# Patient Record
Sex: Male | Born: 1950 | Race: Black or African American | Hispanic: No | Smoking: Never smoker
Health system: Southern US, Community
[De-identification: ages and names within clinical notes are randomized; demographics above are authoritative.]

## PROBLEM LIST (undated history)

## (undated) DIAGNOSIS — C61 Malignant neoplasm of prostate: Secondary | ICD-10-CM

---

## 2004-06-20 ENCOUNTER — Other Ambulatory Visit: Payer: Self-pay

## 2005-02-17 ENCOUNTER — Emergency Department: Payer: Self-pay | Admitting: Emergency Medicine

## 2005-02-28 ENCOUNTER — Ambulatory Visit: Payer: Self-pay | Admitting: General Practice

## 2005-03-07 ENCOUNTER — Ambulatory Visit: Payer: Self-pay | Admitting: General Practice

## 2006-08-26 ENCOUNTER — Ambulatory Visit: Payer: Self-pay | Admitting: Gastroenterology

## 2008-02-23 ENCOUNTER — Ambulatory Visit: Payer: Self-pay | Admitting: General Practice

## 2008-03-16 ENCOUNTER — Ambulatory Visit: Payer: Self-pay | Admitting: General Practice

## 2019-03-07 ENCOUNTER — Other Ambulatory Visit: Payer: Self-pay

## 2019-03-08 ENCOUNTER — Encounter (INDEPENDENT_AMBULATORY_CARE_PROVIDER_SITE_OTHER): Payer: Self-pay

## 2019-03-08 ENCOUNTER — Other Ambulatory Visit: Payer: Self-pay

## 2019-03-08 ENCOUNTER — Ambulatory Visit
Admission: RE | Admit: 2019-03-08 | Discharge: 2019-03-08 | Disposition: A | Discharge status: Home / Self Care | Payer: Medicare Other | Source: Ambulatory Visit | Attending: Radiation Oncology | Admitting: Radiation Oncology

## 2019-03-08 ENCOUNTER — Encounter: Payer: Self-pay | Admitting: Radiation Oncology

## 2019-03-08 VITALS — BP 135/86 | HR 63 | Temp 97.1°F | Resp 18 | Ht 73.0 in | Wt 213.4 lb

## 2019-03-08 DIAGNOSIS — R972 Elevated prostate specific antigen [PSA]: Secondary | ICD-10-CM | POA: Diagnosis not present

## 2019-03-08 DIAGNOSIS — C61 Malignant neoplasm of prostate: Secondary | ICD-10-CM | POA: Insufficient documentation

## 2019-03-08 HISTORY — DX: Malignant neoplasm of prostate: C61

## 2019-03-08 NOTE — Consult Note (Signed)
NEW PATIENT EVALUATION  Name: Jason Richards  MRN: [id number]  Date:   03/08/2019     DOB: [birth year]   This 68 y.o. 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  REFERRING PHYSICIAN: No ref. provider found  CHIEF COMPLAINT:  Chief Complaint  Patient presents with  . Prostate Cancer    Initial consultation    DIAGNOSIS: The encounter diagnosis was Malignant neoplasm of prostate ([place]).   PREVIOUS INVESTIGATIONS:  Pathology report reviewed MRI and bone scan report reviewed Clinical notes reviewed  HPI: Patient is a 68 year old male presented with an elevated PSA back in 2010 of 5.  He originally had an MRI scan of his prostate back in 2017 with no significant findings.  MRI scan in 2019 showed a defined focus within the right anterior lateral body appendix of the prostate with a regular prostatic capsule possibly representing high-grade prostate cancer.  He underwent prostate biopsy back in September 2019 showing 3 of 6 cores positive for Gleason 6 (3+3) adenocarcinoma and a 27 cc prostate.  He had repeat biopsy with 28- cores and 3+ cores.  His PSA has climbed to 27.5.  He had a bone scan which according to records were negative for evidence of metastatic disease.  He is part of the [place] system is now referred close to home for consideration of treatment.  Patient is opted for radiation therapy.  He has some urinary frequency and urgency nocturia x2-3.  PLANNED TREATMENT REGIMEN: IMRT radiation therapy to prostate  PAST MEDICAL HISTORY:  has a past medical history of Prostate cancer ([place]).    PAST SURGICAL HISTORY: History reviewed. No pertinent surgical history.  FAMILY HISTORY: family history is not on file.  SOCIAL HISTORY:  reports that he has never smoked. He has never used smokeless tobacco. He reports that he does not drink alcohol or use drugs.  ALLERGIES:  Patient has no known allergies.  MEDICATIONS:  Current Outpatient Medications  Medication Sig Dispense Refill  . aspirin EC 81 MG tablet Take 81 mg by mouth daily.    . Multiple Vitamin (MULTIVITAMIN WITH MINERALS) TABS tablet Take 1 tablet by mouth daily.     No current facility-administered medications for this encounter.     ECOG PERFORMANCE STATUS:  1 - Symptomatic but completely ambulatory  REVIEW OF SYSTEMS: Patient denies any weight loss, fatigue, weakness, fever, chills or night sweats. Patient denies any loss of vision, blurred vision. Patient denies any ringing  of the ears or hearing loss. No irregular heartbeat. Patient denies heart murmur or history of fainting. Patient denies any chest pain or pain radiating to her upper extremities. Patient denies any shortness of breath, difficulty breathing at night, cough or hemoptysis. Patient denies any swelling in the lower legs. Patient denies any nausea vomiting, vomiting of blood, or coffee ground material in the vomitus. Patient denies any stomach pain. Patient states has had normal bowel movements no significant constipation or diarrhea. Patient denies any dysuria, hematuria or significant nocturia. Patient denies any problems walking, swelling in the joints or loss of balance. Patient denies any skin changes, loss of hair or loss of weight. Patient denies any excessive worrying or anxiety or significant depression. Patient denies any problems with insomnia. Patient denies excessive thirst, polyuria, polydipsia. Patient denies any swollen glands, patient denies easy bruising or easy bleeding. Patient denies any recent infections, allergies or URI. Patient "  s visual fields have not changed significantly in recent time.   PHYSICAL EXAM: BP 135/86 (BP Location: Left Arm, Patient Position: Sitting)   Pulse 63   Temp (!) 97.1 F (36.2 C) (Tympanic)   Resp 18   Ht 6\' 1"  (1.854 m)   Wt 213 lb 6.5 oz (96.8 kg)   BMI 28.16 kg/m   Well-developed well-nourished patient in NAD. HEENT reveals PERLA, EOMI, discs not visualized.  Oral cavity is clear. No oral mucosal lesions are identified. Neck is clear without evidence of cervical or supraclavicular adenopathy. Lungs are clear to A&P. Cardiac examination is essentially unremarkable with regular rate and rhythm without murmur rub or thrill. Abdomen is benign with no organomegaly or masses noted. Motor sensory and DTR levels are equal and symmetric in the upper and lower extremities. Cranial nerves II through XII are grossly intact. Proprioception is intact. No peripheral adenopathy or edema is identified. No motor or sensory levels are noted. Crude visual fields are within normal range.  LABORATORY DATA: Pathology reports reviewed    RADIOLOGY RESULTS: MRI and bone scan report reviewed   IMPRESSION: Stage IIb high risk prostate cancer based on elevated PSA in 68 year old male with unusual Gleason 6 adenocarcinoma  PLAN: I have run the [facility] nomogram on the patient.  This shows a 64% chance of extracapsular extension and a 7% chance of lymph node involvement.  I have recommended and discussed both external beam treatment and I-125 interstitial implant.  Patient is interested in external beam IMRT treatment.  I would plan on delivering 8000 cGy over 8 weeks.  Risks and benefits of treatment including increased lower urinary tract symptoms diarrhea fatigue alteration of blood counts skin reaction all were described in detail to the patient.  He seems to comprehend my treatment plan well.  I have personally set up and ordered CT simulation.  Also would like the patient to be on Lupron suppression and according to the patient he is already received his first dose we will double check on that.  Patient seems to comprehend my treatment plan well.  I would like to take this opportunity to thank you for allowing me to participate in the care of your patient.Noreene Filbert, MD

## 2019-03-09 ENCOUNTER — Other Ambulatory Visit: Payer: Self-pay

## 2019-03-10 ENCOUNTER — Ambulatory Visit
Admission: RE | Admit: 2019-03-10 | Discharge: 2019-03-10 | Disposition: A | Discharge status: Home / Self Care | Payer: Medicare Other | Source: Ambulatory Visit | Attending: Radiation Oncology | Admitting: Radiation Oncology

## 2019-03-10 ENCOUNTER — Other Ambulatory Visit: Payer: Self-pay

## 2019-03-10 DIAGNOSIS — C61 Malignant neoplasm of prostate: Secondary | ICD-10-CM | POA: Diagnosis not present

## 2019-03-10 DIAGNOSIS — Z510 Encounter for antineoplastic radiation therapy: Secondary | ICD-10-CM | POA: Insufficient documentation

## 2019-03-11 DIAGNOSIS — Z510 Encounter for antineoplastic radiation therapy: Secondary | ICD-10-CM | POA: Diagnosis not present

## 2019-03-12 ENCOUNTER — Other Ambulatory Visit: Payer: Self-pay | Admitting: *Deleted

## 2019-03-12 DIAGNOSIS — C61 Malignant neoplasm of prostate: Secondary | ICD-10-CM

## 2019-03-18 ENCOUNTER — Ambulatory Visit
Admission: RE | Admit: 2019-03-18 | Discharge: 2019-03-18 | Disposition: A | Discharge status: Home / Self Care | Payer: Medicare Other | Source: Ambulatory Visit | Attending: Radiation Oncology | Admitting: Radiation Oncology

## 2019-03-18 ENCOUNTER — Other Ambulatory Visit: Payer: Self-pay

## 2019-03-22 ENCOUNTER — Ambulatory Visit
Admission: RE | Admit: 2019-03-22 | Discharge: 2019-03-22 | Disposition: A | Discharge status: Home / Self Care | Payer: Medicare Other | Source: Ambulatory Visit | Attending: Radiation Oncology | Admitting: Radiation Oncology

## 2019-03-22 ENCOUNTER — Other Ambulatory Visit: Payer: Self-pay

## 2019-03-22 DIAGNOSIS — C61 Malignant neoplasm of prostate: Secondary | ICD-10-CM | POA: Insufficient documentation

## 2019-03-22 DIAGNOSIS — Z510 Encounter for antineoplastic radiation therapy: Secondary | ICD-10-CM | POA: Insufficient documentation

## 2019-03-23 ENCOUNTER — Other Ambulatory Visit: Payer: Self-pay

## 2019-03-23 ENCOUNTER — Ambulatory Visit
Admission: RE | Admit: 2019-03-23 | Discharge: 2019-03-23 | Disposition: A | Discharge status: Home / Self Care | Payer: Medicare Other | Source: Ambulatory Visit | Attending: Radiation Oncology | Admitting: Radiation Oncology

## 2019-03-23 DIAGNOSIS — Z510 Encounter for antineoplastic radiation therapy: Secondary | ICD-10-CM | POA: Diagnosis not present

## 2019-03-24 ENCOUNTER — Other Ambulatory Visit: Payer: Self-pay

## 2019-03-24 ENCOUNTER — Ambulatory Visit
Admission: RE | Admit: 2019-03-24 | Discharge: 2019-03-24 | Disposition: A | Discharge status: Home / Self Care | Payer: Medicare Other | Source: Ambulatory Visit | Attending: Radiation Oncology | Admitting: Radiation Oncology

## 2019-03-24 DIAGNOSIS — Z510 Encounter for antineoplastic radiation therapy: Secondary | ICD-10-CM | POA: Diagnosis not present

## 2019-03-25 ENCOUNTER — Other Ambulatory Visit: Payer: Self-pay

## 2019-03-25 ENCOUNTER — Ambulatory Visit
Admission: RE | Admit: 2019-03-25 | Discharge: 2019-03-25 | Disposition: A | Discharge status: Home / Self Care | Payer: Medicare Other | Source: Ambulatory Visit | Attending: Radiation Oncology | Admitting: Radiation Oncology

## 2019-03-25 DIAGNOSIS — Z510 Encounter for antineoplastic radiation therapy: Secondary | ICD-10-CM | POA: Diagnosis not present

## 2019-03-26 ENCOUNTER — Other Ambulatory Visit: Payer: Self-pay

## 2019-03-26 ENCOUNTER — Ambulatory Visit
Admission: RE | Admit: 2019-03-26 | Discharge: 2019-03-26 | Disposition: A | Discharge status: Home / Self Care | Payer: Medicare Other | Source: Ambulatory Visit | Attending: Radiation Oncology | Admitting: Radiation Oncology

## 2019-03-26 DIAGNOSIS — Z510 Encounter for antineoplastic radiation therapy: Secondary | ICD-10-CM | POA: Diagnosis not present

## 2019-03-29 ENCOUNTER — Ambulatory Visit
Admission: RE | Admit: 2019-03-29 | Discharge: 2019-03-29 | Disposition: A | Discharge status: Home / Self Care | Payer: Medicare Other | Source: Ambulatory Visit | Attending: Radiation Oncology | Admitting: Radiation Oncology

## 2019-03-29 ENCOUNTER — Other Ambulatory Visit: Payer: Self-pay

## 2019-03-29 DIAGNOSIS — Z510 Encounter for antineoplastic radiation therapy: Secondary | ICD-10-CM | POA: Diagnosis not present

## 2019-03-30 ENCOUNTER — Other Ambulatory Visit: Payer: Self-pay | Admitting: *Deleted

## 2019-03-30 ENCOUNTER — Other Ambulatory Visit: Payer: Self-pay

## 2019-03-30 ENCOUNTER — Ambulatory Visit
Admission: RE | Admit: 2019-03-30 | Discharge: 2019-03-30 | Disposition: A | Discharge status: Home / Self Care | Payer: Medicare Other | Source: Ambulatory Visit | Attending: Radiation Oncology | Admitting: Radiation Oncology

## 2019-03-30 DIAGNOSIS — Z510 Encounter for antineoplastic radiation therapy: Secondary | ICD-10-CM | POA: Diagnosis not present

## 2019-03-30 MED ORDER — TAMSULOSIN HCL 0.4 MG PO CAPS: 0.4000 mg | ORAL_CAPSULE | Freq: Every day | ORAL | 6 refills | Status: DC

## 2019-03-31 ENCOUNTER — Other Ambulatory Visit: Payer: Self-pay

## 2019-03-31 ENCOUNTER — Ambulatory Visit
Admission: RE | Admit: 2019-03-31 | Discharge: 2019-03-31 | Disposition: A | Discharge status: Home / Self Care | Payer: Medicare Other | Source: Ambulatory Visit | Attending: Radiation Oncology | Admitting: Radiation Oncology

## 2019-03-31 DIAGNOSIS — Z510 Encounter for antineoplastic radiation therapy: Secondary | ICD-10-CM | POA: Diagnosis not present

## 2019-04-01 ENCOUNTER — Ambulatory Visit
Admission: RE | Admit: 2019-04-01 | Discharge: 2019-04-01 | Disposition: A | Discharge status: Home / Self Care | Payer: Medicare Other | Source: Ambulatory Visit | Attending: Radiation Oncology | Admitting: Radiation Oncology

## 2019-04-01 ENCOUNTER — Other Ambulatory Visit: Payer: Self-pay

## 2019-04-01 DIAGNOSIS — Z510 Encounter for antineoplastic radiation therapy: Secondary | ICD-10-CM | POA: Diagnosis not present

## 2019-04-02 ENCOUNTER — Ambulatory Visit
Admission: RE | Admit: 2019-04-02 | Discharge: 2019-04-02 | Disposition: A | Discharge status: Home / Self Care | Payer: Medicare Other | Source: Ambulatory Visit | Attending: Radiation Oncology | Admitting: Radiation Oncology

## 2019-04-02 ENCOUNTER — Other Ambulatory Visit: Payer: Self-pay

## 2019-04-02 DIAGNOSIS — Z510 Encounter for antineoplastic radiation therapy: Secondary | ICD-10-CM | POA: Diagnosis not present

## 2019-04-05 ENCOUNTER — Inpatient Hospital Stay: Payer: Medicare Other | Attending: Radiation Oncology

## 2019-04-05 ENCOUNTER — Ambulatory Visit
Admission: RE | Admit: 2019-04-05 | Discharge: 2019-04-05 | Disposition: A | Discharge status: Home / Self Care | Payer: Medicare Other | Source: Ambulatory Visit | Attending: Radiation Oncology | Admitting: Radiation Oncology

## 2019-04-05 ENCOUNTER — Other Ambulatory Visit: Payer: Self-pay

## 2019-04-05 DIAGNOSIS — Z510 Encounter for antineoplastic radiation therapy: Secondary | ICD-10-CM | POA: Diagnosis not present

## 2019-04-05 DIAGNOSIS — C61 Malignant neoplasm of prostate: Secondary | ICD-10-CM | POA: Insufficient documentation

## 2019-04-06 ENCOUNTER — Ambulatory Visit
Admission: RE | Admit: 2019-04-06 | Discharge: 2019-04-06 | Disposition: A | Discharge status: Home / Self Care | Payer: Medicare Other | Source: Ambulatory Visit | Attending: Radiation Oncology | Admitting: Radiation Oncology

## 2019-04-06 ENCOUNTER — Other Ambulatory Visit: Payer: Self-pay

## 2019-04-06 DIAGNOSIS — Z510 Encounter for antineoplastic radiation therapy: Secondary | ICD-10-CM | POA: Diagnosis not present

## 2019-04-07 ENCOUNTER — Other Ambulatory Visit: Payer: Self-pay

## 2019-04-07 ENCOUNTER — Ambulatory Visit
Admission: RE | Admit: 2019-04-07 | Discharge: 2019-04-07 | Disposition: A | Discharge status: Home / Self Care | Payer: Medicare Other | Source: Ambulatory Visit | Attending: Radiation Oncology | Admitting: Radiation Oncology

## 2019-04-07 DIAGNOSIS — Z510 Encounter for antineoplastic radiation therapy: Secondary | ICD-10-CM | POA: Diagnosis not present

## 2019-04-08 ENCOUNTER — Ambulatory Visit
Admission: RE | Admit: 2019-04-08 | Discharge: 2019-04-08 | Disposition: A | Discharge status: Home / Self Care | Payer: Medicare Other | Source: Ambulatory Visit | Attending: Radiation Oncology | Admitting: Radiation Oncology

## 2019-04-08 ENCOUNTER — Other Ambulatory Visit: Payer: Self-pay

## 2019-04-08 DIAGNOSIS — Z510 Encounter for antineoplastic radiation therapy: Secondary | ICD-10-CM | POA: Diagnosis not present

## 2019-04-09 ENCOUNTER — Other Ambulatory Visit: Payer: Self-pay

## 2019-04-09 ENCOUNTER — Ambulatory Visit
Admission: RE | Admit: 2019-04-09 | Discharge: 2019-04-09 | Disposition: A | Discharge status: Home / Self Care | Payer: Medicare Other | Source: Ambulatory Visit | Attending: Radiation Oncology | Admitting: Radiation Oncology

## 2019-04-09 DIAGNOSIS — Z510 Encounter for antineoplastic radiation therapy: Secondary | ICD-10-CM | POA: Diagnosis not present

## 2019-04-12 ENCOUNTER — Ambulatory Visit
Admission: RE | Admit: 2019-04-12 | Discharge: 2019-04-12 | Disposition: A | Discharge status: Home / Self Care | Payer: Medicare Other | Source: Ambulatory Visit | Attending: Radiation Oncology | Admitting: Radiation Oncology

## 2019-04-12 DIAGNOSIS — Z510 Encounter for antineoplastic radiation therapy: Secondary | ICD-10-CM | POA: Diagnosis not present

## 2019-04-13 ENCOUNTER — Other Ambulatory Visit: Payer: Self-pay

## 2019-04-13 ENCOUNTER — Ambulatory Visit
Admission: RE | Admit: 2019-04-13 | Discharge: 2019-04-13 | Disposition: A | Discharge status: Home / Self Care | Payer: Medicare Other | Source: Ambulatory Visit | Attending: Radiation Oncology | Admitting: Radiation Oncology

## 2019-04-13 DIAGNOSIS — Z510 Encounter for antineoplastic radiation therapy: Secondary | ICD-10-CM | POA: Diagnosis not present

## 2019-04-14 ENCOUNTER — Ambulatory Visit
Admission: RE | Admit: 2019-04-14 | Discharge: 2019-04-14 | Disposition: A | Discharge status: Home / Self Care | Payer: Medicare Other | Source: Ambulatory Visit | Attending: Radiation Oncology | Admitting: Radiation Oncology

## 2019-04-14 ENCOUNTER — Other Ambulatory Visit: Payer: Self-pay

## 2019-04-14 DIAGNOSIS — Z510 Encounter for antineoplastic radiation therapy: Secondary | ICD-10-CM | POA: Diagnosis not present

## 2019-04-15 ENCOUNTER — Ambulatory Visit
Admission: RE | Admit: 2019-04-15 | Discharge: 2019-04-15 | Disposition: A | Discharge status: Home / Self Care | Payer: Medicare Other | Source: Ambulatory Visit | Attending: Radiation Oncology | Admitting: Radiation Oncology

## 2019-04-15 ENCOUNTER — Other Ambulatory Visit: Payer: Self-pay

## 2019-04-15 DIAGNOSIS — Z510 Encounter for antineoplastic radiation therapy: Secondary | ICD-10-CM | POA: Diagnosis not present

## 2019-04-16 ENCOUNTER — Other Ambulatory Visit: Payer: Self-pay

## 2019-04-16 ENCOUNTER — Ambulatory Visit
Admission: RE | Admit: 2019-04-16 | Discharge: 2019-04-16 | Disposition: A | Discharge status: Home / Self Care | Payer: Medicare Other | Source: Ambulatory Visit | Attending: Radiation Oncology | Admitting: Radiation Oncology

## 2019-04-16 DIAGNOSIS — Z510 Encounter for antineoplastic radiation therapy: Secondary | ICD-10-CM | POA: Diagnosis not present

## 2019-04-19 ENCOUNTER — Other Ambulatory Visit: Payer: Self-pay

## 2019-04-19 ENCOUNTER — Ambulatory Visit
Admission: RE | Admit: 2019-04-19 | Discharge: 2019-04-19 | Disposition: A | Discharge status: Home / Self Care | Payer: Medicare Other | Source: Ambulatory Visit | Attending: Radiation Oncology | Admitting: Radiation Oncology

## 2019-04-19 ENCOUNTER — Inpatient Hospital Stay: Payer: Medicare Other

## 2019-04-19 DIAGNOSIS — Z510 Encounter for antineoplastic radiation therapy: Secondary | ICD-10-CM | POA: Diagnosis not present

## 2019-04-19 DIAGNOSIS — C61 Malignant neoplasm of prostate: Secondary | ICD-10-CM | POA: Diagnosis present

## 2019-04-19 LAB — CBC
HCT: 35.2 % — ABNORMAL LOW (ref 39.0–52.0)
Hemoglobin: 11.3 g/dL — ABNORMAL LOW (ref 13.0–17.0)
MCH: 28.3 pg (ref 26.0–34.0)
MCHC: 32.1 g/dL (ref 30.0–36.0)
MCV: 88 fL (ref 80.0–100.0)
Platelets: 233 10*3/uL (ref 150–400)
RBC: 4 MIL/uL — ABNORMAL LOW (ref 4.22–5.81)
RDW: 13 % (ref 11.5–15.5)
WBC: 4.3 10*3/uL (ref 4.0–10.5)
nRBC: 0 % (ref 0.0–0.2)

## 2019-04-20 ENCOUNTER — Ambulatory Visit
Admission: RE | Admit: 2019-04-20 | Discharge: 2019-04-20 | Disposition: A | Discharge status: Home / Self Care | Payer: Medicare Other | Source: Ambulatory Visit | Attending: Radiation Oncology | Admitting: Radiation Oncology

## 2019-04-20 ENCOUNTER — Other Ambulatory Visit: Payer: Self-pay

## 2019-04-20 DIAGNOSIS — Z510 Encounter for antineoplastic radiation therapy: Secondary | ICD-10-CM | POA: Diagnosis not present

## 2019-04-21 ENCOUNTER — Other Ambulatory Visit: Payer: Self-pay

## 2019-04-21 ENCOUNTER — Ambulatory Visit
Admission: RE | Admit: 2019-04-21 | Discharge: 2019-04-21 | Disposition: A | Discharge status: Home / Self Care | Payer: No Typology Code available for payment source | Source: Ambulatory Visit | Attending: Radiation Oncology | Admitting: Radiation Oncology

## 2019-04-21 DIAGNOSIS — C61 Malignant neoplasm of prostate: Secondary | ICD-10-CM | POA: Insufficient documentation

## 2019-04-21 DIAGNOSIS — Z510 Encounter for antineoplastic radiation therapy: Secondary | ICD-10-CM | POA: Insufficient documentation

## 2019-04-22 ENCOUNTER — Other Ambulatory Visit: Payer: Self-pay

## 2019-04-22 ENCOUNTER — Ambulatory Visit
Admission: RE | Admit: 2019-04-22 | Discharge: 2019-04-22 | Disposition: A | Discharge status: Home / Self Care | Payer: No Typology Code available for payment source | Source: Ambulatory Visit | Attending: Radiation Oncology | Admitting: Radiation Oncology

## 2019-04-22 DIAGNOSIS — Z510 Encounter for antineoplastic radiation therapy: Secondary | ICD-10-CM | POA: Diagnosis not present

## 2019-04-26 ENCOUNTER — Ambulatory Visit
Admission: RE | Admit: 2019-04-26 | Discharge: 2019-04-26 | Disposition: A | Discharge status: Home / Self Care | Payer: No Typology Code available for payment source | Source: Ambulatory Visit | Attending: Radiation Oncology | Admitting: Radiation Oncology

## 2019-04-26 ENCOUNTER — Other Ambulatory Visit: Payer: Self-pay

## 2019-04-26 DIAGNOSIS — Z510 Encounter for antineoplastic radiation therapy: Secondary | ICD-10-CM | POA: Diagnosis not present

## 2019-04-27 ENCOUNTER — Ambulatory Visit
Admission: RE | Admit: 2019-04-27 | Discharge: 2019-04-27 | Disposition: A | Discharge status: Home / Self Care | Payer: No Typology Code available for payment source | Source: Ambulatory Visit | Attending: Radiation Oncology | Admitting: Radiation Oncology

## 2019-04-27 ENCOUNTER — Other Ambulatory Visit: Payer: Self-pay

## 2019-04-27 DIAGNOSIS — Z510 Encounter for antineoplastic radiation therapy: Secondary | ICD-10-CM | POA: Diagnosis not present

## 2019-04-28 ENCOUNTER — Ambulatory Visit
Admission: RE | Admit: 2019-04-28 | Discharge: 2019-04-28 | Disposition: A | Discharge status: Home / Self Care | Payer: No Typology Code available for payment source | Source: Ambulatory Visit | Attending: Radiation Oncology | Admitting: Radiation Oncology

## 2019-04-28 ENCOUNTER — Other Ambulatory Visit: Payer: Self-pay

## 2019-04-28 DIAGNOSIS — Z510 Encounter for antineoplastic radiation therapy: Secondary | ICD-10-CM | POA: Diagnosis not present

## 2019-04-29 ENCOUNTER — Ambulatory Visit
Admission: RE | Admit: 2019-04-29 | Discharge: 2019-04-29 | Disposition: A | Discharge status: Home / Self Care | Payer: No Typology Code available for payment source | Source: Ambulatory Visit | Attending: Radiation Oncology | Admitting: Radiation Oncology

## 2019-04-29 ENCOUNTER — Other Ambulatory Visit: Payer: Self-pay

## 2019-04-29 DIAGNOSIS — Z510 Encounter for antineoplastic radiation therapy: Secondary | ICD-10-CM | POA: Diagnosis not present

## 2019-04-30 ENCOUNTER — Other Ambulatory Visit: Payer: Self-pay

## 2019-04-30 ENCOUNTER — Ambulatory Visit
Admission: RE | Admit: 2019-04-30 | Discharge: 2019-04-30 | Disposition: A | Discharge status: Home / Self Care | Payer: No Typology Code available for payment source | Source: Ambulatory Visit | Attending: Radiation Oncology | Admitting: Radiation Oncology

## 2019-04-30 DIAGNOSIS — Z510 Encounter for antineoplastic radiation therapy: Secondary | ICD-10-CM | POA: Diagnosis not present

## 2019-05-03 ENCOUNTER — Ambulatory Visit
Admission: RE | Admit: 2019-05-03 | Discharge: 2019-05-03 | Disposition: A | Discharge status: Home / Self Care | Payer: No Typology Code available for payment source | Source: Ambulatory Visit | Attending: Radiation Oncology | Admitting: Radiation Oncology

## 2019-05-03 ENCOUNTER — Other Ambulatory Visit: Payer: Self-pay

## 2019-05-03 ENCOUNTER — Inpatient Hospital Stay: Payer: No Typology Code available for payment source | Attending: Radiation Oncology

## 2019-05-03 DIAGNOSIS — Z510 Encounter for antineoplastic radiation therapy: Secondary | ICD-10-CM | POA: Insufficient documentation

## 2019-05-03 DIAGNOSIS — C61 Malignant neoplasm of prostate: Secondary | ICD-10-CM

## 2019-05-03 LAB — CBC
HCT: 36.7 % — ABNORMAL LOW (ref 39.0–52.0)
Hemoglobin: 11.5 g/dL — ABNORMAL LOW (ref 13.0–17.0)
MCH: 27.6 pg (ref 26.0–34.0)
MCHC: 31.3 g/dL (ref 30.0–36.0)
MCV: 88.2 fL (ref 80.0–100.0)
Platelets: 274 10*3/uL (ref 150–400)
RBC: 4.16 MIL/uL — ABNORMAL LOW (ref 4.22–5.81)
RDW: 13.1 % (ref 11.5–15.5)
WBC: 4.1 10*3/uL (ref 4.0–10.5)
nRBC: 0 % (ref 0.0–0.2)

## 2019-05-04 ENCOUNTER — Other Ambulatory Visit: Payer: Self-pay

## 2019-05-04 ENCOUNTER — Ambulatory Visit
Admission: RE | Admit: 2019-05-04 | Discharge: 2019-05-04 | Disposition: A | Discharge status: Home / Self Care | Payer: No Typology Code available for payment source | Source: Ambulatory Visit | Attending: Radiation Oncology | Admitting: Radiation Oncology

## 2019-05-04 DIAGNOSIS — Z510 Encounter for antineoplastic radiation therapy: Secondary | ICD-10-CM | POA: Diagnosis not present

## 2019-05-05 ENCOUNTER — Ambulatory Visit
Admission: RE | Admit: 2019-05-05 | Discharge: 2019-05-05 | Disposition: A | Discharge status: Home / Self Care | Payer: No Typology Code available for payment source | Source: Ambulatory Visit | Attending: Radiation Oncology | Admitting: Radiation Oncology

## 2019-05-05 ENCOUNTER — Other Ambulatory Visit: Payer: Self-pay

## 2019-05-05 DIAGNOSIS — Z510 Encounter for antineoplastic radiation therapy: Secondary | ICD-10-CM | POA: Diagnosis not present

## 2019-05-06 ENCOUNTER — Ambulatory Visit
Admission: RE | Admit: 2019-05-06 | Discharge: 2019-05-06 | Disposition: A | Discharge status: Home / Self Care | Payer: No Typology Code available for payment source | Source: Ambulatory Visit | Attending: Radiation Oncology | Admitting: Radiation Oncology

## 2019-05-06 ENCOUNTER — Other Ambulatory Visit: Payer: Self-pay

## 2019-05-06 DIAGNOSIS — Z510 Encounter for antineoplastic radiation therapy: Secondary | ICD-10-CM | POA: Diagnosis not present

## 2019-05-07 ENCOUNTER — Other Ambulatory Visit: Payer: Self-pay

## 2019-05-07 ENCOUNTER — Ambulatory Visit
Admission: RE | Admit: 2019-05-07 | Discharge: 2019-05-07 | Disposition: A | Discharge status: Home / Self Care | Payer: No Typology Code available for payment source | Source: Ambulatory Visit | Attending: Radiation Oncology | Admitting: Radiation Oncology

## 2019-05-07 DIAGNOSIS — Z510 Encounter for antineoplastic radiation therapy: Secondary | ICD-10-CM | POA: Diagnosis not present

## 2019-05-10 ENCOUNTER — Other Ambulatory Visit: Payer: Self-pay

## 2019-05-10 ENCOUNTER — Ambulatory Visit
Admission: RE | Admit: 2019-05-10 | Discharge: 2019-05-10 | Disposition: A | Discharge status: Home / Self Care | Payer: No Typology Code available for payment source | Source: Ambulatory Visit | Attending: Radiation Oncology | Admitting: Radiation Oncology

## 2019-05-10 DIAGNOSIS — Z510 Encounter for antineoplastic radiation therapy: Secondary | ICD-10-CM | POA: Diagnosis not present

## 2019-05-11 ENCOUNTER — Ambulatory Visit
Admission: RE | Admit: 2019-05-11 | Discharge: 2019-05-11 | Disposition: A | Discharge status: Home / Self Care | Payer: No Typology Code available for payment source | Source: Ambulatory Visit | Attending: Radiation Oncology | Admitting: Radiation Oncology

## 2019-05-11 ENCOUNTER — Other Ambulatory Visit: Payer: Self-pay

## 2019-05-11 DIAGNOSIS — Z510 Encounter for antineoplastic radiation therapy: Secondary | ICD-10-CM | POA: Diagnosis not present

## 2019-05-12 ENCOUNTER — Other Ambulatory Visit: Payer: Self-pay

## 2019-05-12 ENCOUNTER — Ambulatory Visit
Admission: RE | Admit: 2019-05-12 | Discharge: 2019-05-12 | Disposition: A | Discharge status: Home / Self Care | Payer: No Typology Code available for payment source | Source: Ambulatory Visit | Attending: Radiation Oncology | Admitting: Radiation Oncology

## 2019-05-12 DIAGNOSIS — Z510 Encounter for antineoplastic radiation therapy: Secondary | ICD-10-CM | POA: Diagnosis not present

## 2019-05-13 ENCOUNTER — Other Ambulatory Visit: Payer: Self-pay

## 2019-05-13 ENCOUNTER — Ambulatory Visit
Admission: RE | Admit: 2019-05-13 | Discharge: 2019-05-13 | Disposition: A | Discharge status: Home / Self Care | Payer: No Typology Code available for payment source | Source: Ambulatory Visit | Attending: Radiation Oncology | Admitting: Radiation Oncology

## 2019-05-13 DIAGNOSIS — Z510 Encounter for antineoplastic radiation therapy: Secondary | ICD-10-CM | POA: Diagnosis not present

## 2019-05-14 ENCOUNTER — Other Ambulatory Visit: Payer: Self-pay

## 2019-05-14 ENCOUNTER — Ambulatory Visit
Admission: RE | Admit: 2019-05-14 | Discharge: 2019-05-14 | Disposition: A | Discharge status: Home / Self Care | Payer: No Typology Code available for payment source | Source: Ambulatory Visit | Attending: Radiation Oncology | Admitting: Radiation Oncology

## 2019-05-14 DIAGNOSIS — Z510 Encounter for antineoplastic radiation therapy: Secondary | ICD-10-CM | POA: Diagnosis not present

## 2019-05-17 ENCOUNTER — Other Ambulatory Visit: Payer: Self-pay

## 2019-05-17 ENCOUNTER — Ambulatory Visit
Admission: RE | Admit: 2019-05-17 | Discharge: 2019-05-17 | Disposition: A | Discharge status: Home / Self Care | Payer: No Typology Code available for payment source | Source: Ambulatory Visit | Attending: Radiation Oncology | Admitting: Radiation Oncology

## 2019-05-17 DIAGNOSIS — Z510 Encounter for antineoplastic radiation therapy: Secondary | ICD-10-CM | POA: Diagnosis not present

## 2019-05-27 ENCOUNTER — Other Ambulatory Visit: Payer: Self-pay

## 2019-06-22 ENCOUNTER — Other Ambulatory Visit: Payer: Self-pay

## 2019-06-23 ENCOUNTER — Encounter: Payer: Self-pay | Admitting: Radiation Oncology

## 2019-06-23 ENCOUNTER — Other Ambulatory Visit: Payer: Self-pay | Admitting: *Deleted

## 2019-06-23 ENCOUNTER — Other Ambulatory Visit: Payer: Self-pay

## 2019-06-23 ENCOUNTER — Ambulatory Visit
Admission: RE | Admit: 2019-06-23 | Discharge: 2019-06-23 | Disposition: A | Discharge status: Home / Self Care | Payer: Medicare Other | Source: Ambulatory Visit | Attending: Radiation Oncology | Admitting: Radiation Oncology

## 2019-06-23 VITALS — BP 104/75 | HR 69 | Temp 96.8°F | Resp 18 | Wt 209.0 lb

## 2019-06-23 DIAGNOSIS — C61 Malignant neoplasm of prostate: Secondary | ICD-10-CM

## 2019-06-23 NOTE — Progress Notes (Signed)
Radiation Oncology Follow up Note  Name: Jason Richards   Date:   06/23/2019 MRN:  [id number] DOB: 1951/09/09    This 68 y.o. male presents to the clinic today for 1 month follow-up status post.  IMRT radiation therapy for Gleason 6 adenocarcinoma the prostate stage IIb based on PSA of 26 presentation  REFERRING PROVIDER: No ref. provider found  HPI: Patient is a 68 year old male now at 1 month having completed radiation therapy to his prostate and pelvic nodes using IMRT treatment planning and delivery for Gleason 6 (3+3)  COMPLICATIONS OF TREATMENT: none  FOLLOW UP COMPLIANCE: keeps appointments   PHYSICAL EXAM:  BP 104/75 (BP Location: Left Arm, Patient Position: Sitting)   Pulse 69   Temp (!) 96.8 F (36 C) (Tympanic)   Resp 18   Wt 209 lb (94.8 kg)   BMI 27.57 kg/m  Well-developed well-nourished patient in NAD. HEENT reveals PERLA, EOMI, discs not visualized.  Oral cavity is clear. No oral mucosal lesions are identified. Neck is clear without evidence of cervical or supraclavicular adenopathy. Lungs are clear to A&P. Cardiac examination is essentially unremarkable with regular rate and rhythm without murmur rub or thrill. Abdomen is benign with no organomegaly or masses noted. Motor sensory and DTR levels are equal and symmetric in the upper and lower extremities. Cranial nerves II through XII are grossly intact. Proprioception is intact. No peripheral adenopathy or edema is identified. No motor or sensory levels are noted. Crude visual fields are within normal range.  RADIOLOGY RESULTS: No current films to review  PLAN: Present time patient is doing well with low side effect profile from his radiation therapy.  He continues on androgen deprivation therapy through urology.  I have asked to see him back in 3 months with a PSA prior to his visit.  Patient knows to call with any concerns.  I would like to take this opportunity to thank you for allowing me to participate in the care  of your patient.Noreene Filbert, MD

## 2019-06-23 NOTE — Progress Notes (Signed)
psa

## 2019-08-30 ENCOUNTER — Other Ambulatory Visit: Payer: Self-pay | Admitting: Radiation Oncology

## 2019-09-23 ENCOUNTER — Other Ambulatory Visit: Payer: Self-pay

## 2019-09-23 ENCOUNTER — Inpatient Hospital Stay: Payer: Medicare Other | Attending: Radiation Oncology

## 2019-09-23 DIAGNOSIS — C61 Malignant neoplasm of prostate: Secondary | ICD-10-CM | POA: Insufficient documentation

## 2019-09-23 LAB — PSA: Prostatic Specific Antigen: 1.04 ng/mL (ref 0.00–4.00)

## 2019-09-29 ENCOUNTER — Ambulatory Visit
Admission: RE | Admit: 2019-09-29 | Discharge: 2019-09-29 | Disposition: A | Discharge status: Home / Self Care | Payer: Medicare Other | Source: Ambulatory Visit | Attending: Radiation Oncology | Admitting: Radiation Oncology

## 2019-09-29 ENCOUNTER — Encounter: Payer: Self-pay | Admitting: Radiation Oncology

## 2019-09-29 ENCOUNTER — Other Ambulatory Visit: Payer: Self-pay

## 2019-09-29 VITALS — BP 123/77 | HR 81 | Temp 98.5°F | Resp 18 | Wt 210.7 lb

## 2019-09-29 DIAGNOSIS — Z923 Personal history of irradiation: Secondary | ICD-10-CM | POA: Diagnosis not present

## 2019-09-29 DIAGNOSIS — C61 Malignant neoplasm of prostate: Secondary | ICD-10-CM

## 2019-09-29 NOTE — Progress Notes (Signed)
Radiation Oncology Follow up Note  Name: Jason Richards   Date:   09/29/2019 MRN:  [id number] DOB: January 28, 1951    This 68 y.o. male presents to the clinic today for 50-month follow-up status post IMRT radiation therapy for Gleason 6 (3+3) adenocarcinoma the prostate with a PSA of 26.Marland Kitchen  REFERRING PROVIDER: No ref. provider found  HPI: Patient is a 68 year old male now out 4 months having completed IMRT radiation therapy to his prostate for Gleason 6 adenocarcinoma the prostate presenting with a PSA of 26.  Seen today in routine follow-up he is doing well specifically denies any increased lower urinary tract symptoms or any problems with diarrhea.  His most recent PSA is 1.04.Marland Kitchen  COMPLICATIONS OF TREATMENT: none  FOLLOW UP COMPLIANCE: keeps appointments   PHYSICAL EXAM:  BP 123/77   Pulse 81   Temp 98.5 F (36.9 C)   Resp 18   Wt 210 lb 11.2 oz (95.6 kg)   BMI 27.80 kg/m  Well-developed well-nourished patient in NAD. HEENT reveals PERLA, EOMI, discs not visualized.  Oral cavity is clear. No oral mucosal lesions are identified. Neck is clear without evidence of cervical or supraclavicular adenopathy. Lungs are clear to A&P. Cardiac examination is essentially unremarkable with regular rate and rhythm without murmur rub or thrill. Abdomen is benign with no organomegaly or masses noted. Motor sensory and DTR levels are equal and symmetric in the upper and lower extremities. Cranial nerves II through XII are grossly intact. Proprioception is intact. No peripheral adenopathy or edema is identified. No motor or sensory levels are noted. Crude visual fields are within normal range.  RADIOLOGY RESULTS: No current films to review  PLAN: Present time patient is doing well with excellent biochemical response to his radiation therapy.  And pleased with his overall progress.  He is having very low side effect profile.  Of asked to see him back in 6 months for follow-up with a PSA prior to that visit.   Patient knows needs to call with any concerns at any time.  I would like to take this opportunity to thank you for allowing me to participate in the care of your patient.Noreene Filbert, MD

## 2019-11-29 ENCOUNTER — Ambulatory Visit: Payer: Medicare Other | Attending: Internal Medicine

## 2019-11-29 DIAGNOSIS — Z23 Encounter for immunization: Secondary | ICD-10-CM

## 2019-11-29 NOTE — Progress Notes (Signed)
   Covid-19 Vaccination Clinic  Name:  Jason Richards    MRN: [id number] DOB: Nov 05, 1950  11/29/2019  [name] was observed post Covid-19 immunization for 15 minutes without incidence. He was provided with Vaccine Information Sheet and instruction to access the V-Safe system.   [name] was instructed to call 911 with any severe reactions post vaccine: Marland Kitchen Difficulty breathing  . Swelling of your face and throat  . A fast heartbeat  . A bad rash all over your body  . Dizziness and weakness    Immunizations Administered    Name Date Dose VIS Date Route   Moderna COVID-19 Vaccine 11/29/2019 10:11 AM 0.5 mL 09/21/2019 Intramuscular   Manufacturer: Moderna   Lot: [id number]   [id number]

## 2019-12-29 ENCOUNTER — Ambulatory Visit: Payer: Medicare Other | Attending: Internal Medicine

## 2019-12-29 DIAGNOSIS — Z23 Encounter for immunization: Secondary | ICD-10-CM

## 2019-12-29 NOTE — Progress Notes (Signed)
   Covid-19 Vaccination Clinic  Name:  [name]    MRN: [id number] DOB: 06/25/1951  12/29/2019  Mr. Jason Richards was observed post Covid-19 immunization for 15 minutes without incident. He was provided with Vaccine Information Sheet and instruction to access the V-Safe system.   Mr. Jason Richards was instructed to call 911 with any severe reactions post vaccine: Marland Kitchen Difficulty breathing  . Swelling of face and throat  . A fast heartbeat  . A bad rash all over body  . Dizziness and weakness   Immunizations Administered    Name Date Dose VIS Date Route   Moderna COVID-19 Vaccine 12/29/2019  9:13 AM 0.5 mL 09/21/2019 Intramuscular   Manufacturer: Moderna   Lot: [id number]   [id number]

## 2020-03-22 ENCOUNTER — Other Ambulatory Visit: Payer: Self-pay

## 2020-03-22 ENCOUNTER — Inpatient Hospital Stay: Payer: Medicare Other | Attending: Radiation Oncology

## 2020-03-22 DIAGNOSIS — C61 Malignant neoplasm of prostate: Secondary | ICD-10-CM | POA: Insufficient documentation

## 2020-03-22 LAB — PSA: Prostatic Specific Antigen: 1.7 ng/mL (ref 0.00–4.00)

## 2020-03-29 ENCOUNTER — Ambulatory Visit
Admission: RE | Admit: 2020-03-29 | Discharge: 2020-03-29 | Disposition: A | Discharge status: Home / Self Care | Payer: Medicare Other | Source: Ambulatory Visit | Attending: Radiation Oncology | Admitting: Radiation Oncology

## 2020-03-29 ENCOUNTER — Other Ambulatory Visit: Payer: Self-pay

## 2020-03-29 ENCOUNTER — Other Ambulatory Visit: Payer: Self-pay | Admitting: *Deleted

## 2020-03-29 ENCOUNTER — Encounter: Payer: Self-pay | Admitting: Radiation Oncology

## 2020-03-29 VITALS — BP 111/76 | HR 69 | Temp 97.6°F | Resp 16 | Wt 207.4 lb

## 2020-03-29 DIAGNOSIS — C61 Malignant neoplasm of prostate: Secondary | ICD-10-CM

## 2020-03-29 DIAGNOSIS — Z923 Personal history of irradiation: Secondary | ICD-10-CM | POA: Insufficient documentation

## 2020-03-29 NOTE — Addendum Note (Signed)
Encounter addended by: Noreene Filbert, MD on: 03/29/2020 12:38 PM  Actions taken: Level of Service modified

## 2020-03-29 NOTE — Progress Notes (Signed)
Radiation Oncology Follow up Note  Name: Jason Richards   Date:   03/29/2020 MRN:  [id number] DOB: [birth year]    This 69 y.o. male presents to the clinic today for 50-month follow-up status post IMRT radiation therapy for Gleason 6 adenocarcinoma the prostate presenting with a PSA of 26.  REFERRING PROVIDER: No ref. provider found  HPI: Patient is a 69 year old male now out 10 months having completed IMRT radiation therapy to his prostate for Gleason 6 adenocarcinoma presenting with a PSA of 26 seen today in routine follow-up he is doing well specifically denies any increased lower urinary tract symptoms diarrhea or fatigue.  His most recent PSA is 1.7 and a slight uptake from 1.0 back 6 months prior..  COMPLICATIONS OF TREATMENT: none  FOLLOW UP COMPLIANCE: keeps appointments   PHYSICAL EXAM:  BP 111/76 (BP Location: Right Arm, Patient Position: Sitting, Cuff Size: Normal)   Pulse 69   Temp 97.6 F (36.4 C) (Tympanic)   Resp 16   Wt 207 lb 6.4 oz (94.1 kg)   BMI 27.36 kg/m  Well-developed well-nourished patient in NAD. HEENT reveals PERLA, EOMI, discs not visualized.  Oral cavity is clear. No oral mucosal lesions are identified. Neck is clear without evidence of cervical or supraclavicular adenopathy. Lungs are clear to A&P. Cardiac examination is essentially unremarkable with regular rate and rhythm without murmur rub or thrill. Abdomen is benign with no organomegaly or masses noted. Motor sensory and DTR levels are equal and symmetric in the upper and lower extremities. Cranial nerves II through XII are grossly intact. Proprioception is intact. No peripheral adenopathy or edema is identified. No motor or sensory levels are noted. Crude visual fields are within normal range.  RADIOLOGY RESULTS: No current films for review  PLAN: At this time I have asked to see back in 6 months with a repeat PSA should there be gradual increasing in his PSA will refer him to medical oncology for  evaluation.  He has a very low side effect profile.  Patient knows to call with any concerns.  I would like to take this opportunity to thank you for allowing me to participate in the care of your patient.Noreene Filbert, MD

## 2020-08-27 ENCOUNTER — Other Ambulatory Visit: Payer: Self-pay | Admitting: Radiation Oncology

## 2020-09-11 ENCOUNTER — Other Ambulatory Visit: Payer: Self-pay | Admitting: *Deleted

## 2020-09-27 ENCOUNTER — Inpatient Hospital Stay: Payer: Medicare Other | Attending: Radiation Oncology

## 2020-09-27 ENCOUNTER — Other Ambulatory Visit: Payer: Self-pay

## 2020-09-27 DIAGNOSIS — R9721 Rising PSA following treatment for malignant neoplasm of prostate: Secondary | ICD-10-CM | POA: Diagnosis not present

## 2020-09-27 DIAGNOSIS — Z923 Personal history of irradiation: Secondary | ICD-10-CM | POA: Diagnosis not present

## 2020-09-27 DIAGNOSIS — C61 Malignant neoplasm of prostate: Secondary | ICD-10-CM

## 2020-09-27 LAB — PSA: Prostatic Specific Antigen: 2.53 ng/mL (ref 0.00–4.00)

## 2020-10-03 ENCOUNTER — Encounter: Payer: Self-pay | Admitting: Radiation Oncology

## 2020-10-04 ENCOUNTER — Ambulatory Visit
Admission: RE | Admit: 2020-10-04 | Discharge: 2020-10-04 | Disposition: A | Discharge status: Home / Self Care | Payer: Medicare Other | Source: Ambulatory Visit | Attending: Radiation Oncology | Admitting: Radiation Oncology

## 2020-10-04 ENCOUNTER — Encounter: Payer: Self-pay | Admitting: Radiation Oncology

## 2020-10-04 VITALS — BP 125/75 | HR 62 | Temp 95.1°F | Wt 208.0 lb

## 2020-10-04 DIAGNOSIS — C61 Malignant neoplasm of prostate: Secondary | ICD-10-CM

## 2020-10-04 NOTE — Progress Notes (Signed)
Radiation Oncology Follow up Note  Name: Jason Richards   Date:   10/04/2020 MRN:  [id number] DOB: June 17, 1951    This 69 y.o. male presents to the clinic today for 52-month follow-up status post IMRT radiation therapy to his prostate for Gleason 6 adenocarcinoma the prostate presenting with a PSA of 26.  REFERRING PROVIDER: No ref. provider found  HPI: Patient is a 69 year old male now out 14 months having completed IMRT image guided radiation therapy to his prostate for Gleason 6 adenocarcinoma presenting with a PSA of 26.  He is seen today in routine follow-up is doing well specifically denies any increased lower urinary tract symptoms diarrhea bone pain or fatigue.Marland Kitchen  His PSA has been slowly climbing went up to 1.76 months ago 7 days ago was 2.53.  He did receive one 65-month injection of Eligard early on his diagnosis.  COMPLICATIONS OF TREATMENT: none  FOLLOW UP COMPLIANCE: keeps appointments   PHYSICAL EXAM:  BP 125/75   Pulse 62   Temp (!) 95.1 F (35.1 C) (Tympanic)   Wt 208 lb (94.3 kg)   BMI 27.44 kg/m  Well-developed well-nourished patient in NAD. HEENT reveals PERLA, EOMI, discs not visualized.  Oral cavity is clear. No oral mucosal lesions are identified. Neck is clear without evidence of cervical or supraclavicular adenopathy. Lungs are clear to A&P. Cardiac examination is essentially unremarkable with regular rate and rhythm without murmur rub or thrill. Abdomen is benign with no organomegaly or masses noted. Motor sensory and DTR levels are equal and symmetric in the upper and lower extremities. Cranial nerves II through XII are grossly intact. Proprioception is intact. No peripheral adenopathy or edema is identified. No motor or sensory levels are noted. Crude visual fields are within normal range.  RADIOLOGY RESULTS: No current films for review  PLAN: At this time based on his initial PSA slowly incremental increase in his PSA at this time I am referring him to medical  oncology for opinion and evaluation.  He certainly may benefit from Lupron therapy or Eligard at this time although I want a full oncology opinion and management at this time.  Patient understands my rationale for his referral.  I have asked to see him back in 6 months for follow-up.  Patient knows to call with any concerns.  I would like to take this opportunity to thank you for allowing me to participate in the care of your patient.Noreene Filbert, MD

## 2020-10-09 ENCOUNTER — Inpatient Hospital Stay: Payer: Medicare Other

## 2020-10-09 ENCOUNTER — Encounter: Payer: Self-pay | Admitting: Internal Medicine

## 2020-10-09 ENCOUNTER — Inpatient Hospital Stay (HOSPITAL_BASED_OUTPATIENT_CLINIC_OR_DEPARTMENT_OTHER): Payer: Medicare Other | Admitting: Internal Medicine

## 2020-10-09 DIAGNOSIS — C61 Malignant neoplasm of prostate: Secondary | ICD-10-CM

## 2020-10-09 DIAGNOSIS — Z923 Personal history of irradiation: Secondary | ICD-10-CM | POA: Diagnosis not present

## 2020-10-09 DIAGNOSIS — R9721 Rising PSA following treatment for malignant neoplasm of prostate: Secondary | ICD-10-CM | POA: Diagnosis not present

## 2020-10-09 NOTE — Progress Notes (Signed)
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  status post IMRT radiation therapy  PSA of 26. [facility] [[place]; Dx/urology; Lupron x1 shot]; Aug 2020 finished RT [Dr.Crystal]  # SURVIVORSHIP:   # GENETICS: P  DIAGNOSIS: Prostate cancer  STAGE:   II      ;  GOALS: Cure  CURRENT/MOST RECENT THERAPY : EBRT    Prostate cancer ([place])  10/09/2020 Initial Diagnosis   Prostate cancer [facility])    Results for Jason, Richards (MRN [id number]) as of 10/09/2020 14:22  Ref. Range 04/19/2019 08:46 05/03/2019 08:19 09/23/2019 10:06 03/22/2020 12:48 09/27/2020 08:14  Prostatic Specific Antigen Latest Ref Range: 0.00 - 4.00 ng/mL   1.04 1.70 2.53    HISTORY OF PRESENTING ILLNESS:  Jason Richards 69 y.o.  male the above history of prostate cancer diagnosed on screening has been referred to [place] for persistent elevation/rising of PSA.  [facility]  Initial concerns for prostatitis rather than malignancy.  Patient had imaging with MRI at [facility].  However subsequently post biopsy diagnosed with prostate cancer.  Patient subsequently treated with external beam radiation with Dr. Donella Stade.  As per patient around the time of diagnosis patient also received Lupron injection.  Patient had been followed by Dr. Donella Stade with serial PSA.  Most recently patient's PSA in December was 1.04; June 2021 up to 1.7; in December 2021 was 2.53.   Patient denies any pain.. Patient denies any nausea vomiting.  Denies any headaches.  Any shortness of breath or cough.  Denies any significant difficulty with urination.   Review of Systems  Constitutional: Negative for chills, diaphoresis,  fever, malaise/fatigue and weight loss.  HENT: Negative for nosebleeds and sore throat.   Eyes: Negative for double vision.  Respiratory: Negative for cough, hemoptysis, sputum production, shortness of breath and wheezing.   Cardiovascular: Negative for chest pain, palpitations, orthopnea and leg swelling.  Gastrointestinal: Negative for abdominal pain, blood in stool, constipation, diarrhea, heartburn, melena, nausea and vomiting.  Genitourinary: Negative for dysuria, frequency and urgency.  Musculoskeletal: Negative for back pain and joint pain.  Skin: Negative.  Negative for itching and rash.  Neurological: Negative for dizziness, tingling, focal weakness, weakness and headaches.  Endo/Heme/Allergies: Does not bruise/bleed easily.  Psychiatric/Behavioral: Negative for depression. The patient is not nervous/anxious and does not have insomnia.      MEDICAL HISTORY:  Past Medical History:  Diagnosis Date  . Prostate cancer [facility])     SURGICAL HISTORY: No past surgical history on file.  SOCIAL HISTORY: Social History   Socioeconomic History  . Marital status: Divorced    Spouse name: Not on file  . Number of children: Not on file  . Years of education: Not on file  . Highest education level: Not on file  Occupational History  . Not on file  Tobacco Use  . Smoking status: Never Smoker  . Smokeless tobacco: Never Used  Substance and Sexual Activity  . Alcohol use: Never  . Drug use: Never  . Sexual activity: Not on file  Other Topics Concern  . Not on file  Social History Narrative   Lives in [place] with mother; son. 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 Remote hx of smoking; no  alcohol.    Social Determinants of Health   Financial Resource Strain: Not on file  Food Insecurity: Not on file  Transportation Needs: Not on file  Physical Activity: Not on file  Stress: Not on file  Social Connections: Not on file  Intimate Partner Violence: Not on file    FAMILY  HISTORY: Family History  Problem Relation Age of Onset  . Diabetes Mother     ALLERGIES:  has No Known Allergies.  MEDICATIONS:  Current Outpatient Medications  Medication Sig Dispense Refill  . aspirin EC 81 MG tablet Take 81 mg by mouth daily.    . Multiple Vitamin (MULTIVITAMIN WITH MINERALS) TABS tablet Take 1 tablet by mouth daily.    . tamsulosin (FLOMAX) 0.4 MG CAPS capsule TAKE 1 CAPSULE (0.4 MG TOTAL) BY MOUTH DAILY AFTER SUPPER. 90 capsule 3   No current facility-administered medications for this visit.      Marland Kitchen  PHYSICAL EXAMINATION: ECOG PERFORMANCE STATUS: 0 - Asymptomatic  Vitals:   10/09/20 1358  BP: 129/84  Pulse: 62  Resp: 16  Temp: (!) 96.5 F (35.8 C)  SpO2: 99%   Filed Weights   10/09/20 1358  Weight: 207 lb 12.8 oz (94.3 kg)    Physical Exam HENT:     Head: Normocephalic and atraumatic.     Mouth/Throat:     Mouth: Oropharynx is clear and moist.     Pharynx: No oropharyngeal exudate.  Eyes:     Pupils: Pupils are equal, round, and reactive to light.  Cardiovascular:     Rate and Rhythm: Normal rate and regular rhythm.  Pulmonary:     Effort: No respiratory distress.     Breath sounds: No wheezing.  Abdominal:     General: Bowel sounds are normal. There is no distension.     Palpations: Abdomen is soft. There is no mass.     Tenderness: There is no abdominal tenderness. There is no guarding or rebound.  Musculoskeletal:        General: No tenderness or edema. Normal range of motion.     Cervical back: Normal range of motion and neck supple.  Skin:    General: Skin is warm.  Neurological:     Mental Status: He is alert and oriented to person, place, and time.  Psychiatric:        Mood and Affect: Affect normal.    Results for Jason, Richards (MRN [id number]) as of 10/09/2020 14:22  Ref. Range 04/19/2019 08:46 05/03/2019 08:19 09/23/2019 10:06 03/22/2020 12:48 09/27/2020 08:14  Prostatic Specific Antigen Latest Ref Range: 0.00 - 4.00  ng/mL   1.04 1.70 2.53    LABORATORY DATA:  I have reviewed the data as listed Lab Results  Component Value Date   WBC 4.1 05/03/2019   HGB 11.5 (L) 05/03/2019   HCT 36.7 (L) 05/03/2019   MCV 88.2 05/03/2019   PLT 274 05/03/2019   No results for input(s): NA, K, CL, CO2, GLUCOSE, BUN, CREATININE, CALCIUM, GFRNONAA, GFRAA, PROT, ALBUMIN, AST, ALT, ALKPHOS, BILITOT, BILIDIR, IBILI in the last 8760 hours.  RADIOGRAPHIC STUDIES: I have personally reviewed the radiological images as listed and agreed with the findings in the report. No results found.  ASSESSMENT & PLAN:   Prostate cancer [facility]) #Prostate adenocarcinoma Stage II [pT1c] Gleason score 3+3 adenocarcinoma s/p external beam radiation-finished August 2020.  As per patient he received Lupron x1 at the [place] around the time of diagnosis.  Patient noted to have rising PSA over the  last 6 to 12 months.    See trend as above.  #Discussed with patient regarding the possibility of " PSA bounce"-post radiation vs. true recurrence.  Recommend reevaluation of the PSA labs [facility] CMP PSA testosterone] in about a month.  Also recommend PET scan for further evaluation of any metastatic disease.  Again clinical suspicion for metastatic disease is low.  Patient also has upcoming appointment with urology at the [facility] in January.  #Given the diagnosis of prostate cancer-patient is a candidate for evaluation with genetic counseling.  Discussed the rationale for genetic testing-including possible changes in therapeutic/surveillance for the patient.  Also discussed the impact of positive genetic testing for sibling/rest of the family.  Patient interested-we will make a referral to genetics.  Thank you Dr.Chrystal for allowing me to participate in the care of your pleasant patient. Please do not hesitate to contact me with questions or concerns in the interim.  # DISPOSITION: # cancel lab appt today.  # genetics counselling- Dx; prostate cancer # in 4 weeks  labs- cbc/cmp/testosterone; PSA; PET scan # follow up in 5 weeks- MD; No labs- Dr.B   All questions were answered. The patient knows to call the clinic with any problems, questions or concerns.    Cammie Sickle, MD 10/09/2020 11:19 PM

## 2020-10-09 NOTE — Assessment & Plan Note (Addendum)
#  Prostate adenocarcinoma Stage II [pT1c] Gleason score 3+3 adenocarcinoma s/p external beam radiation-finished August 2020.  As per patient he received Lupron x1 at the [place] around the time of diagnosis.  Patient noted to have rising PSA over the last 6 to 12 months.    See trend as above.  #Discussed with patient regarding the possibility of " PSA bounce"-post radiation vs. true recurrence.  Recommend reevaluation of the PSA labs [facility] CMP PSA testosterone] in about a month.  Also recommend PET scan for further evaluation of any metastatic disease.  Again clinical suspicion for metastatic disease is low.  Patient also has upcoming appointment with urology at the [facility] in January.  #Given the diagnosis of prostate cancer-patient is a candidate for evaluation with genetic counseling.  Discussed the rationale for genetic testing-including possible changes in therapeutic/surveillance for the patient.  Also discussed the impact of positive genetic testing for sibling/rest of the family.  Patient interested-we will make a referral to genetics.  Thank you Dr.Chrystal for allowing me to participate in the care of your pleasant patient. Please do not hesitate to contact me with questions or concerns in the interim.  # DISPOSITION: # cancel lab appt today.  # genetics counselling- Dx; prostate cancer # in 4 weeks labs- cbc/cmp/testosterone; PSA; PET scan # follow up in 5 weeks- MD; No labs- Dr.B

## 2020-11-07 ENCOUNTER — Ambulatory Visit
Admission: RE | Admit: 2020-11-07 | Discharge: 2020-11-07 | Disposition: A | Discharge status: Home / Self Care | Payer: Medicare Other | Source: Ambulatory Visit | Attending: Internal Medicine | Admitting: Internal Medicine

## 2020-11-07 ENCOUNTER — Other Ambulatory Visit: Payer: Self-pay

## 2020-11-07 DIAGNOSIS — C61 Malignant neoplasm of prostate: Secondary | ICD-10-CM | POA: Diagnosis present

## 2020-11-07 MED ORDER — PIFLIFOLASTAT F 18 (PYLARIFY) INJECTION
9.0000 | Freq: Once | INTRAVENOUS | Status: AC
  Administered 2020-11-07: 9.7 via INTRAVENOUS

## 2020-11-10 ENCOUNTER — Inpatient Hospital Stay: Payer: Medicare Other | Admitting: Licensed Clinical Social Worker

## 2020-11-10 ENCOUNTER — Inpatient Hospital Stay: Payer: Medicare Other | Attending: Internal Medicine | Admitting: Internal Medicine

## 2020-11-22 ENCOUNTER — Telehealth: Payer: Self-pay | Admitting: *Deleted

## 2020-11-22 NOTE — Telephone Encounter (Signed)
Spoke with patient as he missed his apt last week with Dr. Rogue Bussing. Patient actively traveling and was not aware of the missed apt. He elected for a virtual visit. Sent patient link to his text to set up his mychart via phone. Virtual visit scheduled with md next Wednesday 2/9 at 945 am. (time pt's preference). Pt thanked me for calling him.

## 2020-11-29 ENCOUNTER — Inpatient Hospital Stay: Payer: Medicare Other | Attending: Internal Medicine | Admitting: Internal Medicine

## 2020-11-29 ENCOUNTER — Telehealth: Payer: Self-pay | Admitting: Internal Medicine

## 2020-11-29 DIAGNOSIS — C61 Malignant neoplasm of prostate: Secondary | ICD-10-CM | POA: Diagnosis not present

## 2020-11-29 DIAGNOSIS — Z923 Personal history of irradiation: Secondary | ICD-10-CM | POA: Diagnosis not present

## 2020-11-29 NOTE — Assessment & Plan Note (Signed)
#  Prostate adenocarcinoma Stage II [pT1c] Gleason score 3+3 adenocarcinoma s/p external beam radiation-finished August 2020. S/p  [facility] [[facility]  Currently not on any ADT.  #Patient's PET scan reviewed-no evidence of any progressive disease noted in the bones or the viscera; heterogenous uptake in prostate rather than focal-no concerns for recurrence.   #Recommend monitoring the patient's PSA for now.  Hold off any further ADT; or any other adjuvant therapies.  However if patient has significant rising PSA/doubling time less than 6 to 10 months-he could be considered M0 disease.   #Genetics-discussed with patient.  Will make referral next visit.   # DISPOSITION: # follow up in 6 months MD; 1 week prior- cbc/cmp/PSA- Dr.B  # I reviewed the blood work- with the patient in detail; also reviewed the imaging independently [as summarized above]; and with the patient in detail.

## 2020-11-29 NOTE — Progress Notes (Signed)
I connected with Jason Richards on 11/29/20 at  9:45 AM EST by video enabled telemedicine visit and verified that I am speaking with the correct person using two identifiers.  I discussed the limitations, risks, security and privacy concerns of performing an evaluation and management service by telemedicine and the availability of in-person appointments. I also discussed with the patient that there may be a patient responsible charge related to this service. The patient expressed understanding and agreed to proceed.    Other persons participating in the visit and their role in the encounter: RN/medical reconciliation Patient's location: home Provider's location: office  Oncology History Overview Note  #Prostate cancer [diagnosed with screening];IIb (T1 cN0 M0) Gleason 6 (3+3) adenocarcinoma; [facility].  status post IMRT radiation therapy  PSA of 26. [facility] [[place]; Dx/urology; Lupron x1 shot]; Aug 2020 finished RT [Dr.Crystal]  # SURVIVORSHIP:   # GENETICS: P  DIAGNOSIS: Prostate cancer  STAGE:   II      ;  GOALS: Cure  CURRENT/MOST RECENT THERAPY : EBRT    Prostate cancer ([place])  10/09/2020 Initial Diagnosis   Prostate cancer [facility])    Chief Complaint: Prostate cancer   History of present illness:Jason Richards 70 y.o.  male with history of prostate cancer stage II status post radiation August 2020 is here today with results of his PET scan ordered for elevated/rising PSA.  Patient's appointment with urology at [facility] was canceled; and is awaiting a repeat appointment.  Patient denies any bone pain.  Any hot flashes.  Denies any nausea vomiting abdominal pain.   Observation/objective: Alert oriented x3.  No acute distress.  Assessment and plan: Prostate cancer [facility]) #Prostate adenocarcinoma Stage II [pT1c] Gleason score 3+3 adenocarcinoma s/p external beam radiation-finished August 2020. S[facility]  [facility]  Currently not on any ADT.  #Patient's PET  scan reviewed-no evidence of any progressive disease noted in the bones or the viscera; heterogenous uptake in prostate rather than focal-no concerns for recurrence.   #Recommend monitoring the patient's PSA for now.  Hold off any further ADT; or any other adjuvant therapies.  However if patient has significant rising PSA/doubling time less than 6 to 10 months-he could be considered M0 disease.   #Genetics-discussed with patient.  Will make referral next visit.   # DISPOSITION: # follow up in 6 months MD; 1 week prior- cbc/cmp/PSA- Dr.B  # I reviewed the blood work- with the patient in detail; also reviewed the imaging independently [as summarized above]; and with the patient in detail.     Follow-up instructions:  I discussed the assessment and treatment plan with the patient.  The patient was provided an opportunity to ask questions and all were answered.  The patient agreed with the plan and demonstrated understanding of instructions.  The patient was advised to call back or seek an in person evaluation if the symptoms worsen or if the condition fails to improve as anticipated.   Dr. Charlaine Dalton [place] at [facility] 11/29/2020 11:21 AM

## 2020-11-29 NOTE — Telephone Encounter (Signed)
On 2/09- I called pt as he was not available for virual viist. Left VM.  H- Please check with pt is he is interested future   In appt with [place]- This was his second missed appt. GB

## 2021-04-03 ENCOUNTER — Other Ambulatory Visit: Payer: Self-pay | Admitting: *Deleted

## 2021-04-04 ENCOUNTER — Inpatient Hospital Stay: Payer: Medicare Other | Attending: Radiation Oncology

## 2021-04-04 ENCOUNTER — Other Ambulatory Visit: Payer: Self-pay | Admitting: *Deleted

## 2021-04-04 ENCOUNTER — Ambulatory Visit
Admission: RE | Admit: 2021-04-04 | Discharge: 2021-04-04 | Disposition: A | Discharge status: Home / Self Care | Payer: Medicare Other | Source: Ambulatory Visit | Attending: Radiation Oncology | Admitting: Radiation Oncology

## 2021-04-04 VITALS — BP 113/73 | HR 64 | Temp 97.9°F | Resp 16 | Wt 205.6 lb

## 2021-04-04 DIAGNOSIS — C61 Malignant neoplasm of prostate: Secondary | ICD-10-CM | POA: Insufficient documentation

## 2021-04-04 DIAGNOSIS — Z923 Personal history of irradiation: Secondary | ICD-10-CM | POA: Insufficient documentation

## 2021-04-04 LAB — PSA: Prostatic Specific Antigen: 0.8 ng/mL (ref 0.00–4.00)

## 2021-04-04 NOTE — Progress Notes (Signed)
Radiation Oncology Follow up Note  Name: Jason Richards   Date:   04/04/2021 MRN:  [id number] DOB: 27-Feb-1951    This 70 y.o. male presents to the clinic today for 2-year follow-up status post image guided IMRT radiation therapy for Gleason 6 adenocarcinoma prostate presenting with a PSA of 26.  REFERRING PROVIDER: No ref. provider found  HPI: Patient is a 70 year old male now out 2 years having completed image guided IMRT radiation therapy for Gleason 6 adenocarcinoma prostate presenting with a PSA of 26.Marland Kitchen  His PSA 6 months ago was 2.53 up from 1.7 a year ago and we referred him to Dr. B for evaluation and treatment.  He did see him once although has not had any treatment started either with ADT therapy.  He had a PSMA PET scan showing no evidence of metastatic disease.  He specifically denies any increased lower urinary tract symptoms diarrhea or bone pain.  COMPLICATIONS OF TREATMENT: none  FOLLOW UP COMPLIANCE: keeps appointments   PHYSICAL EXAM:  BP 113/73   Pulse 64   Temp 97.9 F (36.6 C) (Tympanic)   Resp 16   Wt 205 lb 9.6 oz (93.3 kg)   BMI 27.13 kg/m  Well-developed well-nourished patient in NAD. HEENT reveals PERLA, EOMI, discs not visualized.  Oral cavity is clear. No oral mucosal lesions are identified. Neck is clear without evidence of cervical or supraclavicular adenopathy. Lungs are clear to A&P. Cardiac examination is essentially unremarkable with regular rate and rhythm without murmur rub or thrill. Abdomen is benign with no organomegaly or masses noted. Motor sensory and DTR levels are equal and symmetric in the upper and lower extremities. Cranial nerves II through XII are grossly intact. Proprioception is intact. No peripheral adenopathy or edema is identified. No motor or sensory levels are noted. Crude visual fields are within normal range.  RADIOLOGY RESULTS: PET scan reviewed compatible with above-stated findings  PLAN: Present I am referring patient back to  Dr. Jacinto Reap do not know why there was a break in his overall care.  He needs to be started on ADT therapy.  Also other therapies for early stage biochemical failure and prostate should be entertained.  I have asked to see him back in 6 months for follow-up.  Patient is to call with any concerns.  I would like to take this opportunity to thank you for allowing me to participate in the care of your patient.Noreene Filbert, MD

## 2021-05-29 ENCOUNTER — Other Ambulatory Visit: Payer: Self-pay

## 2021-05-29 DIAGNOSIS — C61 Malignant neoplasm of prostate: Secondary | ICD-10-CM

## 2021-05-30 ENCOUNTER — Inpatient Hospital Stay: Payer: Medicare Other | Attending: Internal Medicine

## 2021-05-30 DIAGNOSIS — C61 Malignant neoplasm of prostate: Secondary | ICD-10-CM | POA: Diagnosis not present

## 2021-05-30 LAB — COMPREHENSIVE METABOLIC PANEL
ALT: 25 U/L (ref 0–44)
AST: 23 U/L (ref 15–41)
Albumin: 3.8 g/dL (ref 3.5–5.0)
Alkaline Phosphatase: 74 U/L (ref 38–126)
Anion gap: 7 (ref 5–15)
BUN: 15 mg/dL (ref 8–23)
CO2: 26 mmol/L (ref 22–32)
Calcium: 8.9 mg/dL (ref 8.9–10.3)
Chloride: 104 mmol/L (ref 98–111)
Creatinine, Ser: 1.16 mg/dL (ref 0.61–1.24)
GFR, Estimated: >60 mL/min (ref >60)
Glucose, Bld: 112 mg/dL — ABNORMAL HIGH (ref 70–99)
Potassium: 4.1 mmol/L (ref 3.5–5.1)
Sodium: 137 mmol/L (ref 135–145)
Total Bilirubin: 0.9 mg/dL (ref 0.3–1.2)
Total Protein: 7.2 g/dL (ref 6.5–8.1)

## 2021-05-30 LAB — CBC WITH DIFFERENTIAL/PLATELET
Abs Immature Granulocytes: 0.01 10*3/uL (ref 0.00–0.07)
Basophils Absolute: 0 10*3/uL (ref 0.0–0.1)
Basophils Relative: 0 %
Eosinophils Absolute: 0.1 10*3/uL (ref 0.0–0.5)
Eosinophils Relative: 2 %
HCT: 42.4 % (ref 39.0–52.0)
Hemoglobin: 13.9 g/dL (ref 13.0–17.0)
Immature Granulocytes: 0 %
Lymphocytes Relative: 35 %
Lymphs Abs: 1.6 10*3/uL (ref 0.7–4.0)
MCH: 28.6 pg (ref 26.0–34.0)
MCHC: 32.8 g/dL (ref 30.0–36.0)
MCV: 87.2 fL (ref 80.0–100.0)
Monocytes Absolute: 0.4 10*3/uL (ref 0.1–1.0)
Monocytes Relative: 9 %
Neutro Abs: 2.5 10*3/uL (ref 1.7–7.7)
Neutrophils Relative %: 54 %
Platelets: 244 10*3/uL (ref 150–400)
RBC: 4.86 MIL/uL (ref 4.22–5.81)
RDW: 14 % (ref 11.5–15.5)
WBC: 4.7 10*3/uL (ref 4.0–10.5)
nRBC: 0 % (ref 0.0–0.2)

## 2021-05-30 LAB — PSA: Prostatic Specific Antigen: 2.94 ng/mL (ref 0.00–4.00)

## 2021-05-31 LAB — TESTOSTERONE: Testosterone: 438 ng/dL (ref 264–916)

## 2021-06-06 ENCOUNTER — Inpatient Hospital Stay: Payer: Medicare Other | Admitting: Internal Medicine

## 2021-06-29 IMAGING — CT NM PET TUM IMG SKULL BASE T - THIGH
1 of 9 series · 1 of 25 positions shown · non-contrast
Comparison: CT from 0226 of the abdomen and pelvis.

CLINICAL DATA: Initial treatment strategy for prostate cancer post
external beam radiation with rising PSA.

EXAM:
NUCLEAR MEDICINE PET SKULL BASE TO THIGH
TECHNIQUE: 9.7 mCi F18 Piflufolastat (Pylarify) was injected intravenously.
Full-ring PET imaging was performed from the skull base to thigh
after the radiotracer. CT data was obtained and used for attenuation
correction and anatomic localization.

[Series 3: ct wb 5.0 b30f · axial · 5.0mm · 0.98mm/px · 1 of 329 slices shown]
[im 329/329  brain]
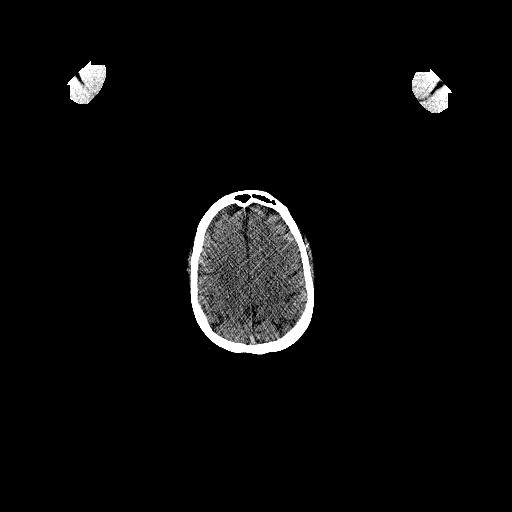

[1 of 25 positions shown; findings below may reference images not displayed]

FINDINGS: NECK

No radiotracer activity in neck lymph nodes.

Incidental CT finding: None

CHEST

No radiotracer accumulation within mediastinal or hilar lymph nodes.
No suspicious pulmonary nodules on the CT scan.

Incidental CT finding: Basilar atelectasis. The no effusion. No
consolidative changes. Normal heart size. Normal caliber central
pulmonary vasculature. The normal appearance of the thoracic aorta.
Limited assessment of cardiovascular structures given lack of
intravenous contrast.

ABDOMEN/PELVIS

Prostate: Mild heterogeneous activity in the prostate bed with a max
SUV of 4.3.

Lymph nodes: No abnormal radiotracer accumulation within pelvic or
abdominal nodes.

Liver: No evidence of liver metastasis

Incidental CT finding: LEFT renal cyst. No hydronephrosis. No acute
gastrointestinal process. No signs of aortic atherosclerosis. Post
bilateral inguinal herniorrhaphy.

SKELETON

No focal  activity to suggest skeletal metastasis.
IMPRESSION: No signs of metastatic disease.

Mild heterogeneous uptake in the prostate post radiotherapy without
focal nature to suggest area of disease recurrence.

## 2021-08-31 ENCOUNTER — Other Ambulatory Visit: Payer: Self-pay | Admitting: Radiation Oncology

## 2021-10-04 ENCOUNTER — Ambulatory Visit
Admission: RE | Admit: 2021-10-04 | Discharge: 2021-10-04 | Disposition: A | Discharge status: Home / Self Care | Payer: Medicare Other | Source: Ambulatory Visit | Attending: Radiation Oncology | Admitting: Radiation Oncology

## 2021-10-04 ENCOUNTER — Other Ambulatory Visit: Payer: Self-pay

## 2021-10-04 ENCOUNTER — Encounter: Payer: Self-pay | Admitting: Radiation Oncology

## 2021-10-04 ENCOUNTER — Encounter (INDEPENDENT_AMBULATORY_CARE_PROVIDER_SITE_OTHER): Payer: Self-pay

## 2021-10-04 VITALS — BP 106/54 | HR 65 | Temp 97.2°F | Wt 205.6 lb

## 2021-10-04 DIAGNOSIS — Z923 Personal history of irradiation: Secondary | ICD-10-CM | POA: Insufficient documentation

## 2021-10-04 DIAGNOSIS — R9721 Rising PSA following treatment for malignant neoplasm of prostate: Secondary | ICD-10-CM | POA: Insufficient documentation

## 2021-10-04 DIAGNOSIS — Z08 Encounter for follow-up examination after completed treatment for malignant neoplasm: Secondary | ICD-10-CM | POA: Diagnosis not present

## 2021-10-04 DIAGNOSIS — C61 Malignant neoplasm of prostate: Secondary | ICD-10-CM | POA: Diagnosis not present

## 2021-10-04 NOTE — Progress Notes (Signed)
Radiation Oncology Follow up Note  Name: Jason Richards   Date:   10/04/2021 MRN:  [id number] DOB: 13-Feb-1951    This 70 y.o. male presents to the clinic today for 2-1/2-year follow-up status post image guided IMRT radiation therapy for Gleason 6 adenocarcinoma presenting with a PSA of 26.  REFERRING PROVIDER: No ref. provider found  HPI: Patient is a 70 year old male now out over 2-1/2 years having completed IMRT radiation therapy for Gleason 6 (3+3) adenocarcinoma the prostate presenting with a PSA of 26 seen today in routine follow-up he is doing well.Marland Kitchen  His PSA has been trending upwards back in August it was 2.9 up from 0.86 months prior he is followed by Dr. B in medical oncology and is being treated I believe with ADT therapy.  He is followed at the [place] they have not posted any new PSA.  He states his having no significant lower urinary tract symptoms no diarrhea or fatigue or bone pain.  COMPLICATIONS OF TREATMENT: none  FOLLOW UP COMPLIANCE: keeps appointments   PHYSICAL EXAM:  BP (!) 106/54    Pulse 65    Temp (!) 97.2 F (36.2 C) (Tympanic)    Wt 205 lb 9.6 oz (93.3 kg)    BMI 27.13 kg/m  Well-developed well-nourished patient in NAD. HEENT reveals PERLA, EOMI, discs not visualized.  Oral cavity is clear. No oral mucosal lesions are identified. Neck is clear without evidence of cervical or supraclavicular adenopathy. Lungs are clear to A&P. Cardiac examination is essentially unremarkable with regular rate and rhythm without murmur rub or thrill. Abdomen is benign with no organomegaly or masses noted. Motor sensory and DTR levels are equal and symmetric in the upper and lower extremities. Cranial nerves II through XII are grossly intact. Proprioception is intact. No peripheral adenopathy or edema is identified. No motor or sensory levels are noted. Crude visual fields are within normal range.  RADIOLOGY RESULTS: No current films to review  PLAN: At the present time I am having  patient's PSA repeated here in January and setting him up for a follow-up visit with Dr. Jacinto Reap.  I have otherwise asked to see him back in 1 year for follow-up.  He may need continuation of his ADT therapy.  Patient comprehends my recommendations well.  I would like to take this opportunity to thank you for allowing me to participate in the care of your patient.Noreene Filbert, MD

## 2021-11-01 ENCOUNTER — Other Ambulatory Visit: Payer: Medicare Other

## 2021-11-06 ENCOUNTER — Inpatient Hospital Stay: Payer: Medicare Other | Attending: Internal Medicine

## 2021-11-08 ENCOUNTER — Inpatient Hospital Stay: Payer: Medicare Other | Admitting: Internal Medicine

## 2021-12-25 DIAGNOSIS — Z23 Encounter for immunization: Secondary | ICD-10-CM | POA: Diagnosis not present

## 2022-08-17 ENCOUNTER — Ambulatory Visit: Payer: Medicare Other

## 2022-08-17 DIAGNOSIS — Z23 Encounter for immunization: Secondary | ICD-10-CM

## 2022-08-21 ENCOUNTER — Other Ambulatory Visit: Payer: Self-pay | Admitting: Radiation Oncology

## 2022-09-24 DIAGNOSIS — H60501 Unspecified acute noninfective otitis externa, right ear: Secondary | ICD-10-CM | POA: Diagnosis not present

## 2022-09-24 DIAGNOSIS — H6691 Otitis media, unspecified, right ear: Secondary | ICD-10-CM | POA: Diagnosis not present

## 2022-10-10 ENCOUNTER — Ambulatory Visit: Payer: Medicare Other | Admitting: Radiation Oncology

## 2022-10-10 ENCOUNTER — Inpatient Hospital Stay: Payer: Medicare Other | Attending: Internal Medicine

## 2022-10-10 DIAGNOSIS — C61 Malignant neoplasm of prostate: Secondary | ICD-10-CM | POA: Insufficient documentation

## 2022-10-17 ENCOUNTER — Encounter: Payer: Self-pay | Admitting: Radiation Oncology

## 2022-10-17 ENCOUNTER — Ambulatory Visit
Admission: RE | Admit: 2022-10-17 | Discharge: 2022-10-17 | Disposition: A | Discharge status: Home / Self Care | Payer: Medicare Other | Source: Ambulatory Visit | Attending: Radiation Oncology | Admitting: Radiation Oncology

## 2022-10-17 ENCOUNTER — Inpatient Hospital Stay: Payer: Medicare Other

## 2022-10-17 VITALS — BP 131/83 | HR 55 | Temp 96.1°F | Resp 15 | Ht 73.0 in | Wt 209.0 lb

## 2022-10-17 DIAGNOSIS — Z923 Personal history of irradiation: Secondary | ICD-10-CM | POA: Insufficient documentation

## 2022-10-17 DIAGNOSIS — Z191 Hormone sensitive malignancy status: Secondary | ICD-10-CM | POA: Diagnosis not present

## 2022-10-17 DIAGNOSIS — C61 Malignant neoplasm of prostate: Secondary | ICD-10-CM | POA: Insufficient documentation

## 2022-10-17 LAB — PSA: Prostatic Specific Antigen: 1.52 ng/mL (ref 0.00–4.00)

## 2022-10-17 NOTE — Progress Notes (Signed)
Radiation Oncology Follow up Note  Name: Jason Richards   Date:   10/17/2022 MRN:  [id number] DOB: 1951/08/21    This 71 y.o. male presents to the clinic today for 3-1/2-year follow-up status post IMRT radiation therapy for Gleason 6 adenocarcinoma of the prostate presenting with a PSA of 26.  REFERRING PROVIDER: No ref. provider found  HPI: Patient is a 71 year old male now out over 3-1/2 years having completed image guided IMRT radiation therapy for Gleason 6 adenocarcinoma presenting with a PSA of 26 seen today in routine follow-up he is doing well specifically denies any increased lower urinary tract symptoms diarrhea or fatigue his most.  His PSA had jumped up to 2.9 a year ago has not had a recent PSA.  He has not been back to medical oncology.  Has not recently been at the [place].  COMPLICATIONS OF TREATMENT: none  FOLLOW UP COMPLIANCE: keeps appointments   PHYSICAL EXAM:  BP 131/83   Pulse (!) 55   Temp (!) 96.1 F (35.6 C)   Resp 15   Ht '6\' 1"'$  (1.854 m)   Wt 209 lb (94.8 kg)   BMI 27.57 kg/m  Well-developed well-nourished patient in NAD. HEENT reveals PERLA, EOMI, discs not visualized.  Oral cavity is clear. No oral mucosal lesions are identified. Neck is clear without evidence of cervical or supraclavicular adenopathy. Lungs are clear to A&P. Cardiac examination is essentially unremarkable with regular rate and rhythm without murmur rub or thrill. Abdomen is benign with no organomegaly or masses noted. Motor sensory and DTR levels are equal and symmetric in the upper and lower extremities. Cranial nerves II through XII are grossly intact. Proprioception is intact. No peripheral adenopathy or edema is identified. No motor or sensory levels are noted. Crude visual fields are within normal range.  RADIOLOGY RESULTS: No current films for review  PLAN: Present time I run a PSA level on him which will be reported separately.  I am trying to get him back to see Dr. B as well as asked  him to return to the [facility] for follow-up care.  Based on his rising PSA he may be a candidate for ADT therapy I will follow-up with him as needed based on medical oncology's recommendations.  I would like to take this opportunity to thank you for allowing me to participate in the care of your patient.Noreene Filbert, MD

## 2024-03-14 DIAGNOSIS — Z8546 Personal history of malignant neoplasm of prostate: Secondary | ICD-10-CM | POA: Diagnosis not present

## 2024-03-14 DIAGNOSIS — Z833 Family history of diabetes mellitus: Secondary | ICD-10-CM | POA: Diagnosis not present

## 2024-03-14 DIAGNOSIS — Z87891 Personal history of nicotine dependence: Secondary | ICD-10-CM | POA: Diagnosis not present

## 2024-03-14 DIAGNOSIS — N400 Enlarged prostate without lower urinary tract symptoms: Secondary | ICD-10-CM | POA: Diagnosis not present

## 2024-03-14 DIAGNOSIS — H04129 Dry eye syndrome of unspecified lacrimal gland: Secondary | ICD-10-CM | POA: Diagnosis not present

## 2024-03-14 DIAGNOSIS — R030 Elevated blood-pressure reading, without diagnosis of hypertension: Secondary | ICD-10-CM | POA: Diagnosis not present

## 2024-07-01 ENCOUNTER — Other Ambulatory Visit: Payer: Self-pay

## 2024-07-01 DIAGNOSIS — C61 Malignant neoplasm of prostate: Secondary | ICD-10-CM

## 2024-07-12 ENCOUNTER — Ambulatory Visit
Admission: RE | Admit: 2024-07-12 | Discharge: 2024-07-12 | Disposition: A | Discharge status: Home / Self Care | Source: Ambulatory Visit

## 2024-07-12 DIAGNOSIS — Z923 Personal history of irradiation: Secondary | ICD-10-CM | POA: Diagnosis not present

## 2024-07-12 DIAGNOSIS — C61 Malignant neoplasm of prostate: Secondary | ICD-10-CM | POA: Insufficient documentation

## 2024-07-12 MED ORDER — FLOTUFOLASTAT F 18 GALLIUM 296-5846 MBQ/ML IV SOLN
8.7700 | Freq: Once | INTRAVENOUS | Status: AC
  Administered 2024-07-12: 8.77 via INTRAVENOUS
  Filled 2024-07-12: qty 9
